# Patient Record
Sex: Male | Born: 2000 | Race: White | Hispanic: No | Marital: Single | Smoking: Current every day smoker
Health system: Southern US, Community
[De-identification: ages and names within clinical notes are randomized; demographics above are authoritative.]

## PROBLEM LIST (undated history)

## (undated) HISTORY — PX: TONSILLECTOMY: SUR1361

## (undated) HISTORY — PX: HAND SURGERY: SHX662

## (undated) HISTORY — PX: TESTICLE SURGERY: SHX794

---
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
## 2018-02-20 ENCOUNTER — Emergency Department
Admission: EM | Admit: 2018-02-20 | Discharge: 2018-02-20 | Disposition: A | Discharge status: Home / Self Care | Payer: No Typology Code available for payment source | Attending: Emergency Medicine | Admitting: Emergency Medicine

## 2018-02-20 ENCOUNTER — Encounter: Payer: Self-pay | Admitting: Emergency Medicine

## 2018-02-20 ENCOUNTER — Other Ambulatory Visit: Payer: Self-pay

## 2018-02-20 DIAGNOSIS — F17210 Nicotine dependence, cigarettes, uncomplicated: Secondary | ICD-10-CM | POA: Diagnosis not present

## 2018-02-20 DIAGNOSIS — Z202 Contact with and (suspected) exposure to infections with a predominantly sexual mode of transmission: Secondary | ICD-10-CM | POA: Insufficient documentation

## 2018-02-20 LAB — URINALYSIS, COMPLETE (UACMP) WITH MICROSCOPIC
BACTERIA UA: NONE SEEN
Bilirubin Urine: NEGATIVE
Glucose, UA: NEGATIVE mg/dL
Hgb urine dipstick: NEGATIVE
KETONES UR: NEGATIVE mg/dL
Nitrite: NEGATIVE
Protein, ur: NEGATIVE mg/dL
SQUAMOUS EPITHELIAL / LPF: NONE SEEN (ref 0–5)
Specific Gravity, Urine: 1.015 (ref 1.005–1.030)
pH: 8 (ref 5.0–8.0)

## 2018-02-20 LAB — CHLAMYDIA/NGC RT PCR (ARMC ONLY)
CHLAMYDIA TR: DETECTED — AB
N GONORRHOEAE: NOT DETECTED

## 2018-02-20 MED ORDER — METRONIDAZOLE 500 MG PO TABS
2000.0000 mg | ORAL_TABLET | Freq: Once | ORAL | Status: AC
  Administered 2018-02-20: 2000 mg via ORAL
  Filled 2018-02-20: qty 4

## 2018-02-20 MED ORDER — AZITHROMYCIN 500 MG PO TABS
1000.0000 mg | ORAL_TABLET | Freq: Once | ORAL | Status: AC
  Administered 2018-02-20: 1000 mg via ORAL
  Filled 2018-02-20: qty 2

## 2018-02-20 MED ORDER — CEFTRIAXONE SODIUM 250 MG IJ SOLR
250.0000 mg | Freq: Once | INTRAMUSCULAR | Status: AC
  Administered 2018-02-20: 250 mg via INTRAMUSCULAR
  Filled 2018-02-20: qty 250

## 2018-02-20 NOTE — ED Provider Notes (Signed)
[facility] Emergency Department Provider Note  ____________________________________________  Time seen: Approximately 8:42 PM  I have reviewed the triage vital signs and the nursing notes.   HISTORY  Chief Complaint Exposure to STD    HPI Frank Nunez is a 17 y.o. male presents to the emergency department after exposure to gonorrhea.  Patient has noticed penile itching but no discharge, dysuria or hematuria.  Patient denies low back pain.  Patient is accompanied by his infant daughter and mother.   History reviewed. No pertinent past medical history.  There are no active problems to display for this patient.   Past Surgical History:  Procedure Laterality Date  . HAND SURGERY    . TESTICLE SURGERY    . TONSILLECTOMY      Prior to Admission medications   Medication Sig Start Date End Date Taking? Authorizing Provider  cyclobenzaprine (FLEXERIL) 10 MG tablet Take 1 tablet (10 mg total) by mouth 3 (three) times daily as needed for muscle spasms. 12/07/17   Willy Eddy, MD  naproxen (NAPROSYN) 500 MG tablet Take 1 tablet (500 mg total) by mouth 2 (two) times daily with a meal. 12/07/17 12/07/18  Willy Eddy, MD    Allergies Morphine and related  No family history on file.  Social History Social History   Tobacco Use  . Smoking status: Current Every Day Smoker    Packs/day: 0.25    Types: Cigarettes  . Smokeless tobacco: Never Used  Substance Use Topics  . Alcohol use: No  . Drug use: Not on file     Review of Systems  Constitutional: No fever/chills Eyes: No visual changes. No discharge ENT: No upper respiratory complaints. Cardiovascular: no chest pain. Respiratory: no cough. No SOB. Gastrointestinal: No abdominal pain.  No nausea, no vomiting.  No diarrhea.  No constipation. Genitourinary: Negative for dysuria. No hematuria. Patient has penile itching.  Musculoskeletal: Negative for musculoskeletal pain. Skin:  Negative for rash, abrasions, lacerations, ecchymosis.  ____________________________________________   PHYSICAL EXAM:  VITAL SIGNS: ED Triage Vitals [02/20/18 1922]  Enc Vitals Group     BP 128/69     Pulse Rate 79     Resp 18     Temp 98.5 F (36.9 C)     Temp Source Oral     SpO2 99 %     Weight 160 lb (72.6 kg)     Height 5\' 10"  (1.778 m)     Head Circumference      Peak Flow      Pain Score 0     Pain Loc      Pain Edu?      Excl. in GC?      Constitutional: Alert and oriented. Well appearing and in no acute distress. Eyes: Conjunctivae are normal. PERRL. EOMI. Head: Atraumatic. Cardiovascular: Normal rate, regular rhythm. Normal S1 and S2.  Good peripheral circulation. Respiratory: Normal respiratory effort without tachypnea or retractions. Lungs CTAB. Good air entry to the bases with no decreased or absent breath sounds. Gastrointestinal: Bowel sounds 4 quadrants. Soft and nontender to palpation. No guarding or rigidity. No palpable masses. No distention. No CVA tenderness. Musculoskeletal: Full range of motion to all extremities. No gross deformities appreciated. Neurologic:  Normal speech and language. No gross focal neurologic deficits are appreciated.  Skin:  Skin is warm, dry and intact. No rash noted. Psychiatric: Mood and affect are normal. Speech and behavior are normal. Patient exhibits appropriate insight and judgement.   ____________________________________________   LABS (  all labs ordered are listed, but only abnormal results are displayed)  Labs Reviewed  URINALYSIS, COMPLETE (UACMP) WITH MICROSCOPIC - Abnormal; Notable for the following components:      Result Value   Color, Urine YELLOW (*)    APPearance CLEAR (*)    Leukocytes, UA TRACE (*)    All other components within normal limits  CHLAMYDIA/NGC RT PCR ([facility] ONLY)    ____________________________________________  EKG   ____________________________________________  RADIOLOGY   No results found.  ____________________________________________    PROCEDURES  Procedure(s) performed:    Procedures    Medications  cefTRIAXone (ROCEPHIN) injection 250 mg (250 mg Intramuscular Given 02/20/18 2006)  azithromycin (ZITHROMAX) tablet 1,000 mg (1,000 mg Oral Given 02/20/18 2006)  metroNIDAZOLE (FLAGYL) tablet 2,000 mg (2,000 mg Oral Given 02/20/18 2006)     ____________________________________________   INITIAL IMPRESSION / ASSESSMENT AND PLAN / ED COURSE  Pertinent labs & imaging results that were available during my care of the patient were reviewed by me and considered in my medical decision making (see chart for details).  Review of the [place] CSRS was performed in accordance of the [facility] prior to dispensing any controlled drugs.    Assessment and plan STD exposure Patient presents to the emergency department after exposure to gonorrhea.  Trace leukocytes were identified on urinalysis.  Patient was treated empirically for gonorrhea, chlamydia and trichomoniasis in the emergency department.  Patient was advised to follow-up with local health department for full STD panel. Patient education regarding safe sex practices were given.     ____________________________________________  FINAL CLINICAL IMPRESSION(S) / ED DIAGNOSES  Final diagnoses:  STD exposure      NEW MEDICATIONS STARTED DURING THIS VISIT:  ED Discharge Orders    None          This chart was dictated using voice recognition software/Dragon. Despite best efforts to proofread, errors can occur which can change the meaning. Any change was purely unintentional.    Orvil FeilWoods, Nikky Duba M, PA-C 02/20/18 2045    Sharyn CreamerQuale, Mark, MD 02/20/18 [phone]

## 2018-02-20 NOTE — ED Triage Notes (Signed)
Patient ambulatory to triage with steady gait, without difficulty or distress noted; st exposed to gonorrhoea; denies any symptoms

## 2018-02-21 ENCOUNTER — Telehealth: Payer: Self-pay | Admitting: Emergency Medicine
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
## 2018-06-29 ENCOUNTER — Emergency Department: Payer: No Typology Code available for payment source

## 2018-06-29 ENCOUNTER — Other Ambulatory Visit: Payer: Self-pay

## 2018-06-29 ENCOUNTER — Emergency Department
Admission: EM | Admit: 2018-06-29 | Discharge: 2018-06-29 | Disposition: A | Discharge status: Home / Self Care | Payer: Self-pay | Attending: Emergency Medicine | Admitting: Emergency Medicine

## 2018-06-29 DIAGNOSIS — M545 Low back pain: Secondary | ICD-10-CM | POA: Insufficient documentation

## 2018-06-29 DIAGNOSIS — R079 Chest pain, unspecified: Secondary | ICD-10-CM

## 2018-06-29 DIAGNOSIS — Z79899 Other long term (current) drug therapy: Secondary | ICD-10-CM | POA: Insufficient documentation

## 2018-06-29 DIAGNOSIS — B349 Viral infection, unspecified: Secondary | ICD-10-CM | POA: Insufficient documentation

## 2018-06-29 DIAGNOSIS — F17210 Nicotine dependence, cigarettes, uncomplicated: Secondary | ICD-10-CM | POA: Insufficient documentation

## 2018-06-29 LAB — COMPREHENSIVE METABOLIC PANEL
ALK PHOS: 53 U/L (ref 38–126)
ALT: 14 U/L (ref 0–44)
ANION GAP: 10 (ref 5–15)
AST: 19 U/L (ref 15–41)
Albumin: 4.5 g/dL (ref 3.5–5.0)
BILIRUBIN TOTAL: 0.5 mg/dL (ref 0.3–1.2)
BUN: 8 mg/dL (ref 6–20)
CALCIUM: 9.1 mg/dL (ref 8.9–10.3)
CO2: 26 mmol/L (ref 22–32)
Chloride: 102 mmol/L (ref 98–111)
Creatinine, Ser: 1.09 mg/dL (ref 0.61–1.24)
GFR calc Af Amer: >60 mL/min (ref >60)
GLUCOSE: 99 mg/dL (ref 70–99)
POTASSIUM: 3.7 mmol/L (ref 3.5–5.1)
Sodium: 138 mmol/L (ref 135–145)
TOTAL PROTEIN: 7.7 g/dL (ref 6.5–8.1)

## 2018-06-29 LAB — MONONUCLEOSIS SCREEN: Mono Screen: NEGATIVE

## 2018-06-29 LAB — INFLUENZA PANEL BY PCR (TYPE A & B)
INFLAPCR: NEGATIVE
Influenza B By PCR: NEGATIVE

## 2018-06-29 LAB — CBC WITH DIFFERENTIAL/PLATELET
Abs Immature Granulocytes: 0.01 10*3/uL (ref 0.00–0.07)
BASOS ABS: 0 10*3/uL (ref 0.0–0.1)
Basophils Relative: 0 %
EOS PCT: 0 %
Eosinophils Absolute: 0 10*3/uL (ref 0.0–0.5)
HEMATOCRIT: 49.1 % (ref 39.0–52.0)
Hemoglobin: 16.4 g/dL (ref 13.0–17.0)
IMMATURE GRANULOCYTES: 0 %
LYMPHS PCT: 22 %
Lymphs Abs: 1.3 10*3/uL (ref 0.7–4.0)
MCH: 30.8 pg (ref 26.0–34.0)
MCHC: 33.4 g/dL (ref 30.0–36.0)
MCV: 92.3 fL (ref 80.0–100.0)
MONOS PCT: 11 %
Monocytes Absolute: 0.7 10*3/uL (ref 0.1–1.0)
Neutro Abs: 4 10*3/uL (ref 1.7–7.7)
Neutrophils Relative %: 67 %
Platelets: 182 10*3/uL (ref 150–400)
RBC: 5.32 MIL/uL (ref 4.22–5.81)
RDW: 11.9 % (ref 11.5–15.5)
WBC: 6 10*3/uL (ref 4.0–10.5)
nRBC: 0 % (ref 0.0–0.2)

## 2018-06-29 MED ORDER — DOXYCYCLINE HYCLATE 100 MG PO TABS
100.0000 mg | ORAL_TABLET | Freq: Once | ORAL | Status: AC
  Administered 2018-06-29: 100 mg via ORAL
  Filled 2018-06-29: qty 1

## 2018-06-29 MED ORDER — DOXYCYCLINE HYCLATE 100 MG PO TABS: 100.0000 mg | ORAL_TABLET | Freq: Two times a day (BID) | ORAL | 0 refills | Status: AC

## 2018-06-29 MED ORDER — IBUPROFEN 600 MG PO TABS
600.0000 mg | ORAL_TABLET | Freq: Once | ORAL | Status: AC
  Administered 2018-06-29: 600 mg via ORAL
  Filled 2018-06-29: qty 1

## 2018-06-29 NOTE — ED Provider Notes (Signed)
[facility] Emergency Department Provider Note  ____________________________________________   First MD Initiated Contact with Patient 06/29/18 0149     (approximate)  I have reviewed the triage vital signs and the nursing notes.   HISTORY  Chief Complaint Back Pain and Chest Pain   HPI Frank Nunez is a 17 y.o. male presents to the emergency department with several weeks of low back pain and several days of upper chest "tightness".  He also reports subjective fever and shaking chills at home for the past day or so which prompted the visit today.  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  His girlfriend was recently diagnosed with "bronchitis" and he is concerned he might have the same.  He denies rash.  He has no past medical history takes no medications.  He denies ever using intravenous drugs.  His symptoms are now constant moderate severity and nothing seems to make them better or worse.    No past medical history on file.  There are no active problems to display for this patient.   Past Surgical History:  Procedure Laterality Date  . HAND SURGERY    . TESTICLE SURGERY    . TONSILLECTOMY      Prior to Admission medications   Medication Sig Start Date End Date Taking? Authorizing Provider  cyclobenzaprine (FLEXERIL) 10 MG tablet Take 1 tablet (10 mg total) by mouth 3 (three) times daily as needed for muscle spasms. 12/07/17   Willy Eddy, MD  doxycycline (VIBRA-TABS) 100 MG tablet Take 1 tablet (100 mg total) by mouth 2 (two) times daily. 06/29/18   Merrily Brittle, MD  naproxen (NAPROSYN) 500 MG tablet Take 1 tablet (500 mg total) by mouth 2 (two) times daily with a meal. 12/07/17 12/07/18  Willy Eddy, MD    Allergies Morphine and related  No family history on file.  Social History Social History   Tobacco Use  . Smoking status: Current Every Day Smoker   Packs/day: 0.25    Types: Cigarettes  . Smokeless tobacco: Never Used  Substance Use Topics  . Alcohol use: No  . Drug use: Not on file    Review of Systems Constitutional: Positive for fevers and chills Eyes: No visual changes. ENT: Positive for sore throat. Cardiovascular: Positive for chest pain. Respiratory: Denies shortness of breath. Gastrointestinal: No abdominal pain.  No nausea, no vomiting.  No diarrhea.  No constipation. Genitourinary: Negative for dysuria. Musculoskeletal: Positive for back pain. Skin: Negative for rash. Neurological: Positive for headache   ____________________________________________   PHYSICAL EXAM:  VITAL SIGNS: ED Triage Vitals  Enc Vitals Group     BP 06/29/18 0012 (!) 110/58     Pulse Rate 06/29/18 0012 86     Resp 06/29/18 0012 18     Temp 06/29/18 0012 98.4 F (36.9 C)     Temp Source 06/29/18 0012 Oral     SpO2 06/29/18 0012 98 %     Weight 06/29/18 0012 165 lb (74.8 kg)     Height 06/29/18 0012 5\' 10"  (1.778 m)     Head Circumference --      Peak Flow --      Pain Score 06/29/18 0011 10     Pain Loc --      Pain Edu? --      Excl. in GC? --     Constitutional: Alert and oriented x4 appears uncomfortable and  moderately diaphoretic Eyes: PERRL EOMI. Head: Atraumatic. Nose: No congestion/rhinnorhea. Mouth/Throat: No trismus uvula midline with no pharyngeal erythema or exudate Neck: No stridor.   Cardiovascular: Normal rate, regular rhythm.  2 out of 6 systolic murmur best heard at left sternal border Respiratory: Slightly increased respiratory effort.  No retractions. Lungs CTAB and moving good air Gastrointestinal: Soft nontender Musculoskeletal: No lower extremity edema   Neurologic:  Normal speech and language. No gross focal neurologic deficits are appreciated. Skin: Diaphoretic Psychiatric: Mood and affect are normal. Speech and behavior are normal.    ____________________________________________   DIFFERENTIAL  includes but not limited to  [facility] mountain spotted fever, ehrlichiosis, bacteremia, endocarditis, influenza, mononucleosis ____________________________________________   LABS (all labs ordered are listed, but only abnormal results are displayed)  Labs Reviewed  CULTURE, BLOOD (ROUTINE X 2)  CULTURE, BLOOD (ROUTINE X 2)  COMPREHENSIVE METABOLIC PANEL  CBC WITH DIFFERENTIAL/PLATELET  INFLUENZA PANEL BY PCR (TYPE A & B)  MONONUCLEOSIS SCREEN    Lab work reviewed by me with no acute disease __________________________________________  EKG  ED ECG REPORT I, Merrily Brittle, the attending physician, personally viewed and interpreted this ECG.  Date: 06/29/2018 EKG Time:  Rate: 63 Rhythm: normal sinus rhythm QRS Axis: normal Intervals: normal ST/T Wave abnormalities: normal Narrative Interpretation: no evidence of acute ischemia  ____________________________________________  RADIOLOGY  Chest x-ray reviewed by me with no acute disease ____________________________________________   PROCEDURES  Procedure(s) performed: no  Procedures  Critical Care performed: no  ____________________________________________   INITIAL IMPRESSION / ASSESSMENT AND PLAN / ED COURSE  Pertinent labs & imaging results that were available during my care of the patient were reviewed by me and considered in my medical decision making (see chart for details).   As part of my medical decision making, I reviewed the following data within the electronic MEDICAL RECORD NUMBER History obtained from family if available, nursing notes, old chart and ekg, as well as notes from prior ED visits.  Patient comes to the emergency department with low back pain, chest pain, headache, sore throat along with subjective fever and diaphoresis here.  Differential is quite broad and most concerning would be endocarditis versus discitis etc.  He adamantly denies ever using intravenous drugs.  He does work outside and is  exposed to ticks raising concern for ehrlichiosis versus [facility] spotted fever.  Influenza and mononucleosis testing is done and I will also draw blood cultures but I think is most reasonable to treat him with doxycycline for a week and refer him back to primary care.  Strict return precautions have been given and the patient verbalizes understanding and agreement with the plan.      ____________________________________________   FINAL CLINICAL IMPRESSION(S) / ED DIAGNOSES  Final diagnoses:  Chest pain, unspecified type  Viral syndrome      NEW MEDICATIONS STARTED DURING THIS VISIT:  Discharge Medication List as of 06/29/2018  3:30 AM    START taking these medications   Details  doxycycline (VIBRA-TABS) 100 MG tablet Take 1 tablet (100 mg total) by mouth 2 (two) times daily., Starting Sun 06/29/2018, Print         Note:  This document was prepared using Dragon voice recognition software and may include unintentional dictation errors.     Merrily Brittle, MD 06/29/18 [phone]

## 2018-06-29 NOTE — Discharge Instructions (Signed)
Please take all of your antibiotics as prescribed and make an appointment to follow-up with your primary care physician tomorrow for a recheck.

## 2018-06-29 NOTE — ED Triage Notes (Signed)
Reports lower back pain "for weeks" tonight with some chest tightness.

## 2018-07-04 LAB — CULTURE, BLOOD (ROUTINE X 2): CULTURE: NO GROWTH

## 2018-11-12 ENCOUNTER — Emergency Department
Admission: EM | Admit: 2018-11-12 | Discharge: 2018-11-12 | Disposition: A | Discharge status: Home / Self Care | Payer: Self-pay | Attending: Emergency Medicine | Admitting: Emergency Medicine

## 2018-11-12 ENCOUNTER — Emergency Department: Payer: Self-pay

## 2018-11-12 ENCOUNTER — Other Ambulatory Visit: Payer: Self-pay

## 2018-11-12 ENCOUNTER — Encounter: Payer: Self-pay | Admitting: Emergency Medicine

## 2018-11-12 DIAGNOSIS — F17210 Nicotine dependence, cigarettes, uncomplicated: Secondary | ICD-10-CM | POA: Insufficient documentation

## 2018-11-12 DIAGNOSIS — M7542 Impingement syndrome of left shoulder: Secondary | ICD-10-CM | POA: Insufficient documentation

## 2018-11-12 DIAGNOSIS — Z79899 Other long term (current) drug therapy: Secondary | ICD-10-CM | POA: Insufficient documentation

## 2018-11-12 MED ORDER — MELOXICAM 15 MG PO TABS: 15.0000 mg | ORAL_TABLET | Freq: Every day | ORAL | 0 refills | Status: AC

## 2018-11-12 MED ORDER — MELOXICAM 7.5 MG PO TABS: 15.0000 mg | ORAL_TABLET | Freq: Once | ORAL | Status: DC

## 2018-11-12 MED ORDER — TRAMADOL HCL 50 MG PO TABS
50.0000 mg | ORAL_TABLET | Freq: Once | ORAL | Status: AC
  Administered 2018-11-12: 50 mg via ORAL
  Filled 2018-11-12: qty 1

## 2018-11-12 MED ORDER — TRAMADOL HCL 50 MG PO TABS: 50.0000 mg | ORAL_TABLET | Freq: Four times a day (QID) | ORAL | 0 refills | Status: AC | PRN

## 2018-11-12 MED ORDER — IBUPROFEN 800 MG PO TABS
800.0000 mg | ORAL_TABLET | Freq: Once | ORAL | Status: AC
  Administered 2018-11-12: 800 mg via ORAL
  Filled 2018-11-12: qty 1

## 2018-11-12 NOTE — ED Provider Notes (Signed)
[facility] Emergency Department Provider Note  ____________________________________________  Time seen: Approximately 11:00 PM  I have reviewed the triage vital signs and the nursing notes.   HISTORY  Chief Complaint Clavicle Injury    HPI Frank Nunez is a 18 y.o. male who presents the emergency department complaining of left shoulder/clavicular pain.  Patient denies any trauma to the shoulder but he states that he has had increasing pain over the past several days.  Patient reports that in the remote past he had a history of clavicle fracture that felt similarly.  Patient denies any neck pain, radicular symptoms in the left upper extremity.  Patient has not tried any medications at home for his complaint.  He denies any other complaints of headache, neck pain, chest pain, shortness of breath abdominal pain, nausea vomiting.    History reviewed. No pertinent past medical history.  There are no active problems to display for this patient.   Past Surgical History:  Procedure Laterality Date  . HAND SURGERY    . TESTICLE SURGERY    . TONSILLECTOMY      Prior to Admission medications   Medication Sig Start Date End Date Taking? Authorizing Provider  cyclobenzaprine (FLEXERIL) 10 MG tablet Take 1 tablet (10 mg total) by mouth 3 (three) times daily as needed for muscle spasms. 12/07/17   Willy Eddy, MD  doxycycline (VIBRA-TABS) 100 MG tablet Take 1 tablet (100 mg total) by mouth 2 (two) times daily. 06/29/18   Merrily Brittle, MD  meloxicam (MOBIC) 15 MG tablet Take 1 tablet (15 mg total) by mouth daily. 11/12/18   Quinnlyn Hearns, Delorise Royals, PA-C  naproxen (NAPROSYN) 500 MG tablet Take 1 tablet (500 mg total) by mouth 2 (two) times daily with a meal. 12/07/17 12/07/18  Willy Eddy, MD  traMADol (ULTRAM) 50 MG tablet Take 1 tablet (50 mg total) by mouth every 6 (six) hours as needed. 11/12/18   Silvia Markuson, Delorise Royals, PA-C    Allergies Morphine and  related  No family history on file.  Social History Social History   Tobacco Use  . Smoking status: Current Every Day Smoker    Packs/day: 0.25    Types: Cigarettes  . Smokeless tobacco: Never Used  Substance Use Topics  . Alcohol use: No  . Drug use: Not on file     Review of Systems  Constitutional: No fever/chills Eyes: No visual changes.  Cardiovascular: no chest pain. Respiratory: no cough. No SOB. Gastrointestinal: No abdominal pain.  No nausea, no vomiting.  Musculoskeletal: Positive for L shoulder/clavicle pain Skin: Negative for rash, abrasions, lacerations, ecchymosis. Neurological: Negative for headaches, focal weakness or numbness. 10-point ROS otherwise negative.  ____________________________________________   PHYSICAL EXAM:  VITAL SIGNS: ED Triage Vitals  Enc Vitals Group     BP 11/12/18 2159 133/65     Pulse Rate 11/12/18 2159 80     Resp 11/12/18 2159 18     Temp 11/12/18 2159 98 F (36.7 C)     Temp Source 11/12/18 2159 Oral     SpO2 11/12/18 2159 98 %     Weight 11/12/18 2158 160 lb (72.6 kg)     Height 11/12/18 2158  (1.778 m)     Head Circumference --      Peak Flow --      Pain Score 11/12/18 2158 10     Pain Loc --      Pain Edu? --      Excl. in GC? --  Constitutional: Alert and oriented. Well appearing and in no acute distress. Eyes: Conjunctivae are normal. PERRL. EOMI. Head: Atraumatic. Neck: No stridor.  No cervical spine tenderness to palpation.  Cardiovascular: Normal rate, regular rhythm. Normal S1 and S2.  Good peripheral circulation. Respiratory: Normal respiratory effort without tachypnea or retractions. Lungs CTAB. Good air entry to the bases with no decreased or absent breath sounds. Musculoskeletal: Full range of motion to all extremities. No gross deformities appreciated.  Visualization of the left shoulder reveals no visible signs of trauma.  Patient has full range of motion to the left shoulder.  Patient is  tender to palpation over the distal one third of the clavicle as well as over the acromioclavicular joint space.  No other tenderness to palpation.  Special tests are negative.  Radial pulse intact distally.  Sensation intact distally. Neurologic:  Normal speech and language. No gross focal neurologic deficits are appreciated.  Skin:  Skin is warm, dry and intact. No rash noted. Psychiatric: Mood and affect are normal. Speech and behavior are normal. Patient exhibits appropriate insight and judgement.   ____________________________________________   LABS (all labs ordered are listed, but only abnormal results are displayed)  Labs Reviewed - No data to display ____________________________________________  EKG   ____________________________________________  RADIOLOGY I personally viewed and evaluated these images as part of my medical decision making, as well as reviewing the written report by the radiologist.  Dg Clavicle Left  Result Date: 11/12/2018 CLINICAL DATA:  Left clavicular pain. Previous fracture treated with anti-inflammatory agents. EXAM: LEFT CLAVICLE - 2+ VIEWS COMPARISON:  None. FINDINGS: Left clavicle appears intact. No acute fracture or dislocation. Coracoclavicular and acromioclavicular spaces are maintained. No focal bone lesion or bone destruction. Soft tissues are unremarkable. IMPRESSION: No acute bony abnormalities. Electronically Signed   By: Burman Nieves M.D.   On: 11/12/2018 22:45    ____________________________________________    PROCEDURES  Procedure(s) performed:    Procedures    Medications  meloxicam (MOBIC) tablet 15 mg (has no administration in time range)  traMADol (ULTRAM) tablet 50 mg (has no administration in time range)     ____________________________________________   INITIAL IMPRESSION / ASSESSMENT AND PLAN / ED COURSE  Pertinent labs & imaging results that were available during my care of the patient were reviewed by me  and considered in my medical decision making (see chart for details).  Review of the [place] CSRS was performed in accordance of the [facility] prior to dispensing any controlled drugs.      Patient's diagnosis is consistent with impingement syndrome of the left shoulder.  Patient presents emergency department complaining of left shoulder pain.  Patient is tender to palpation of the acromioclavicular joint space.  No recent trauma to the area.  No other concerning findings or symptoms.  X-ray reveals no acute osseous abnormality.  No acromioclavicular joint space widening.  Patient will be placed on meloxicam and very limited prescription for Motrin..  Follow-up with primary care or orthopedics as needed.  Patient is given ED precautions to return to the ED for any worsening or new symptoms.     ____________________________________________  FINAL CLINICAL IMPRESSION(S) / ED DIAGNOSES  Final diagnoses:  Impingement syndrome of left shoulder      NEW MEDICATIONS STARTED DURING THIS VISIT:  ED Discharge Orders         Ordered    meloxicam (MOBIC) 15 MG tablet  Daily     11/12/18 2309    traMADol (ULTRAM) 50 MG tablet  Every 6 hours PRN     11/12/18 2309              This chart was dictated using voice recognition software/Dragon. Despite best efforts to proofread, errors can occur which can change the meaning. Any change was purely unintentional.    Racheal Patches, PA-C 11/12/18 2309    Phineas Semen, MD 11/12/18 [phone]

## 2018-11-12 NOTE — ED Triage Notes (Signed)
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

## 2019-06-09 IMAGING — CR DG CHEST 2V
2 series · 2 of 2 positions shown · non-contrast
Comparison: 04/01/2015.

CLINICAL DATA: Chest pain for 1 week.  Smoker.

EXAM:
CHEST - 2 VIEW

[chest pa]
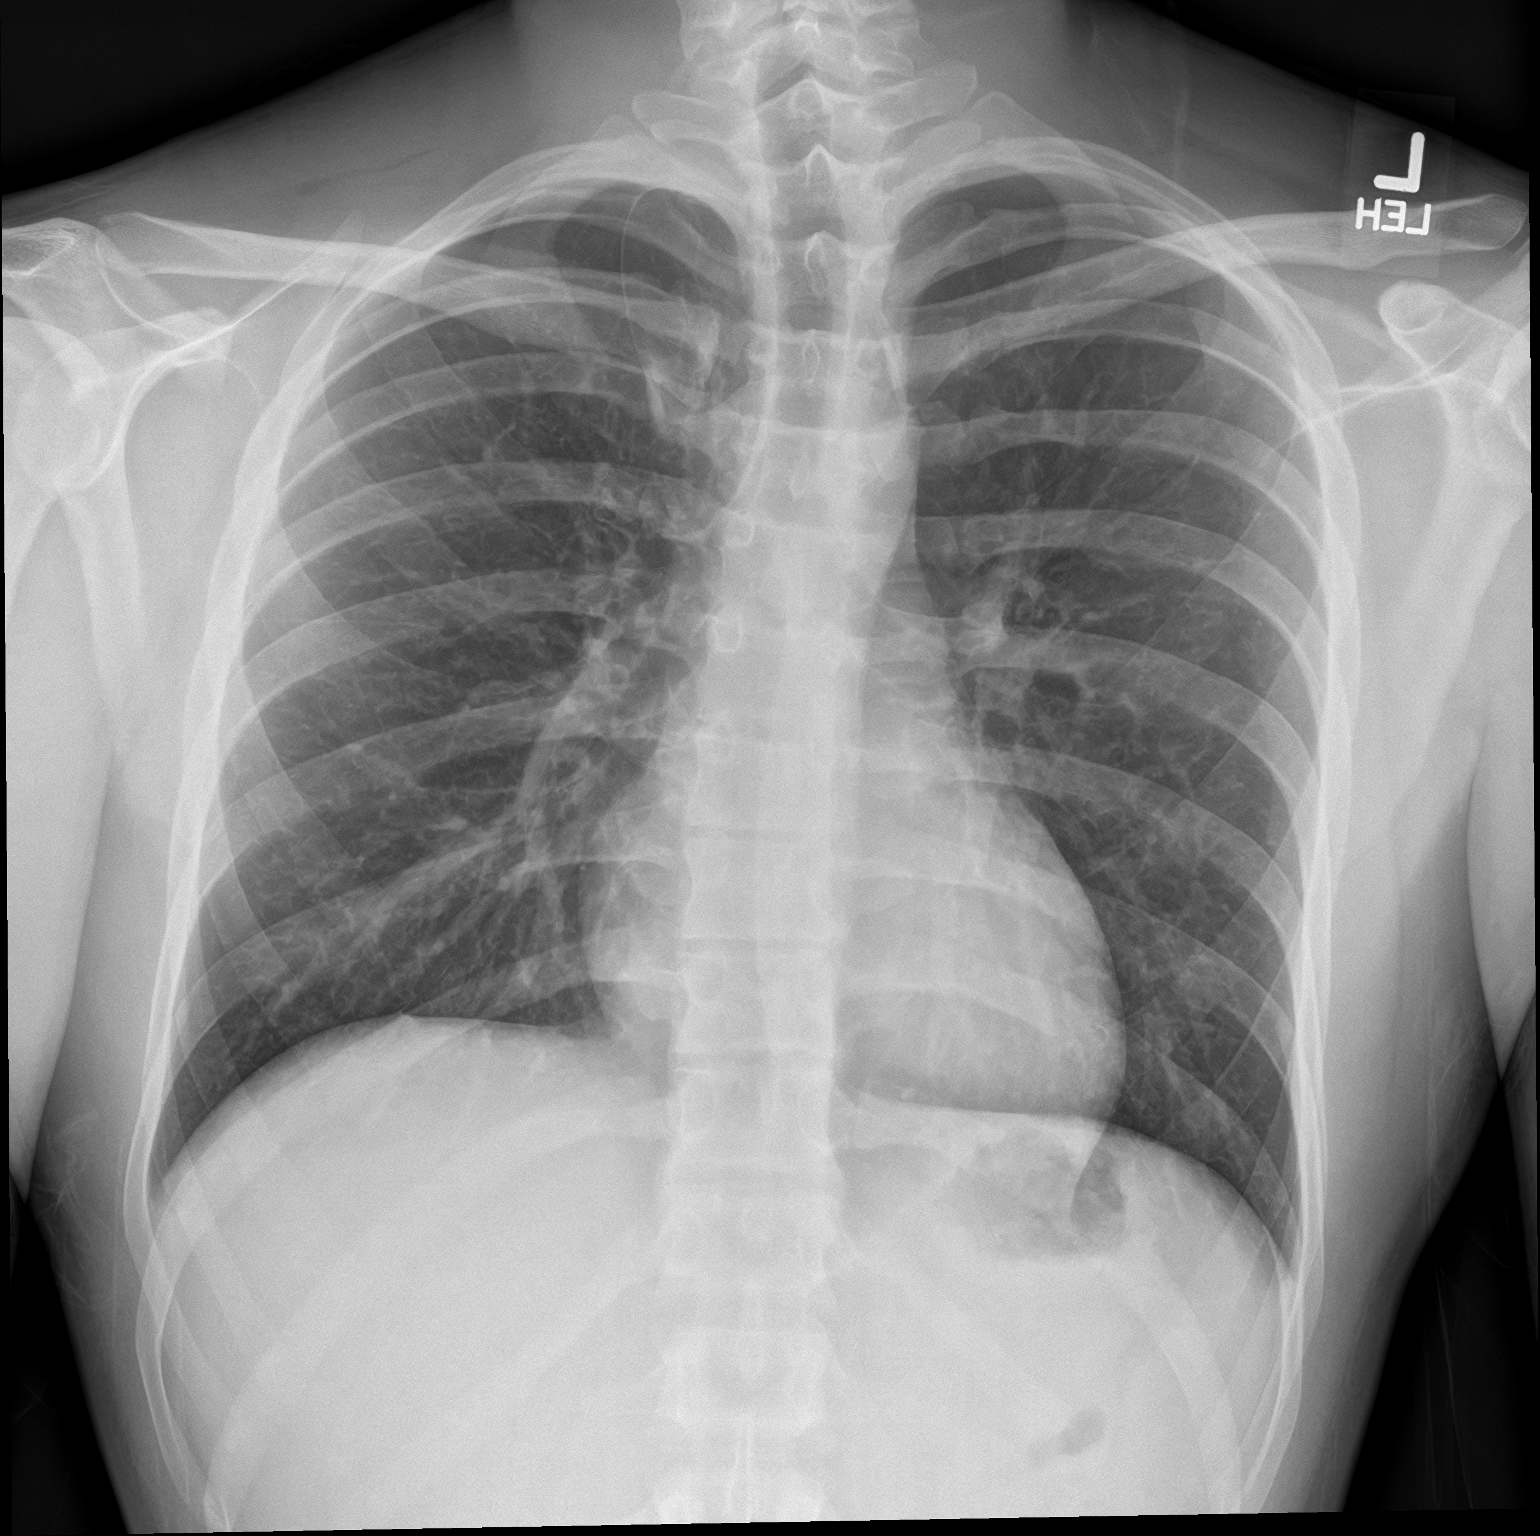

[chest lat]
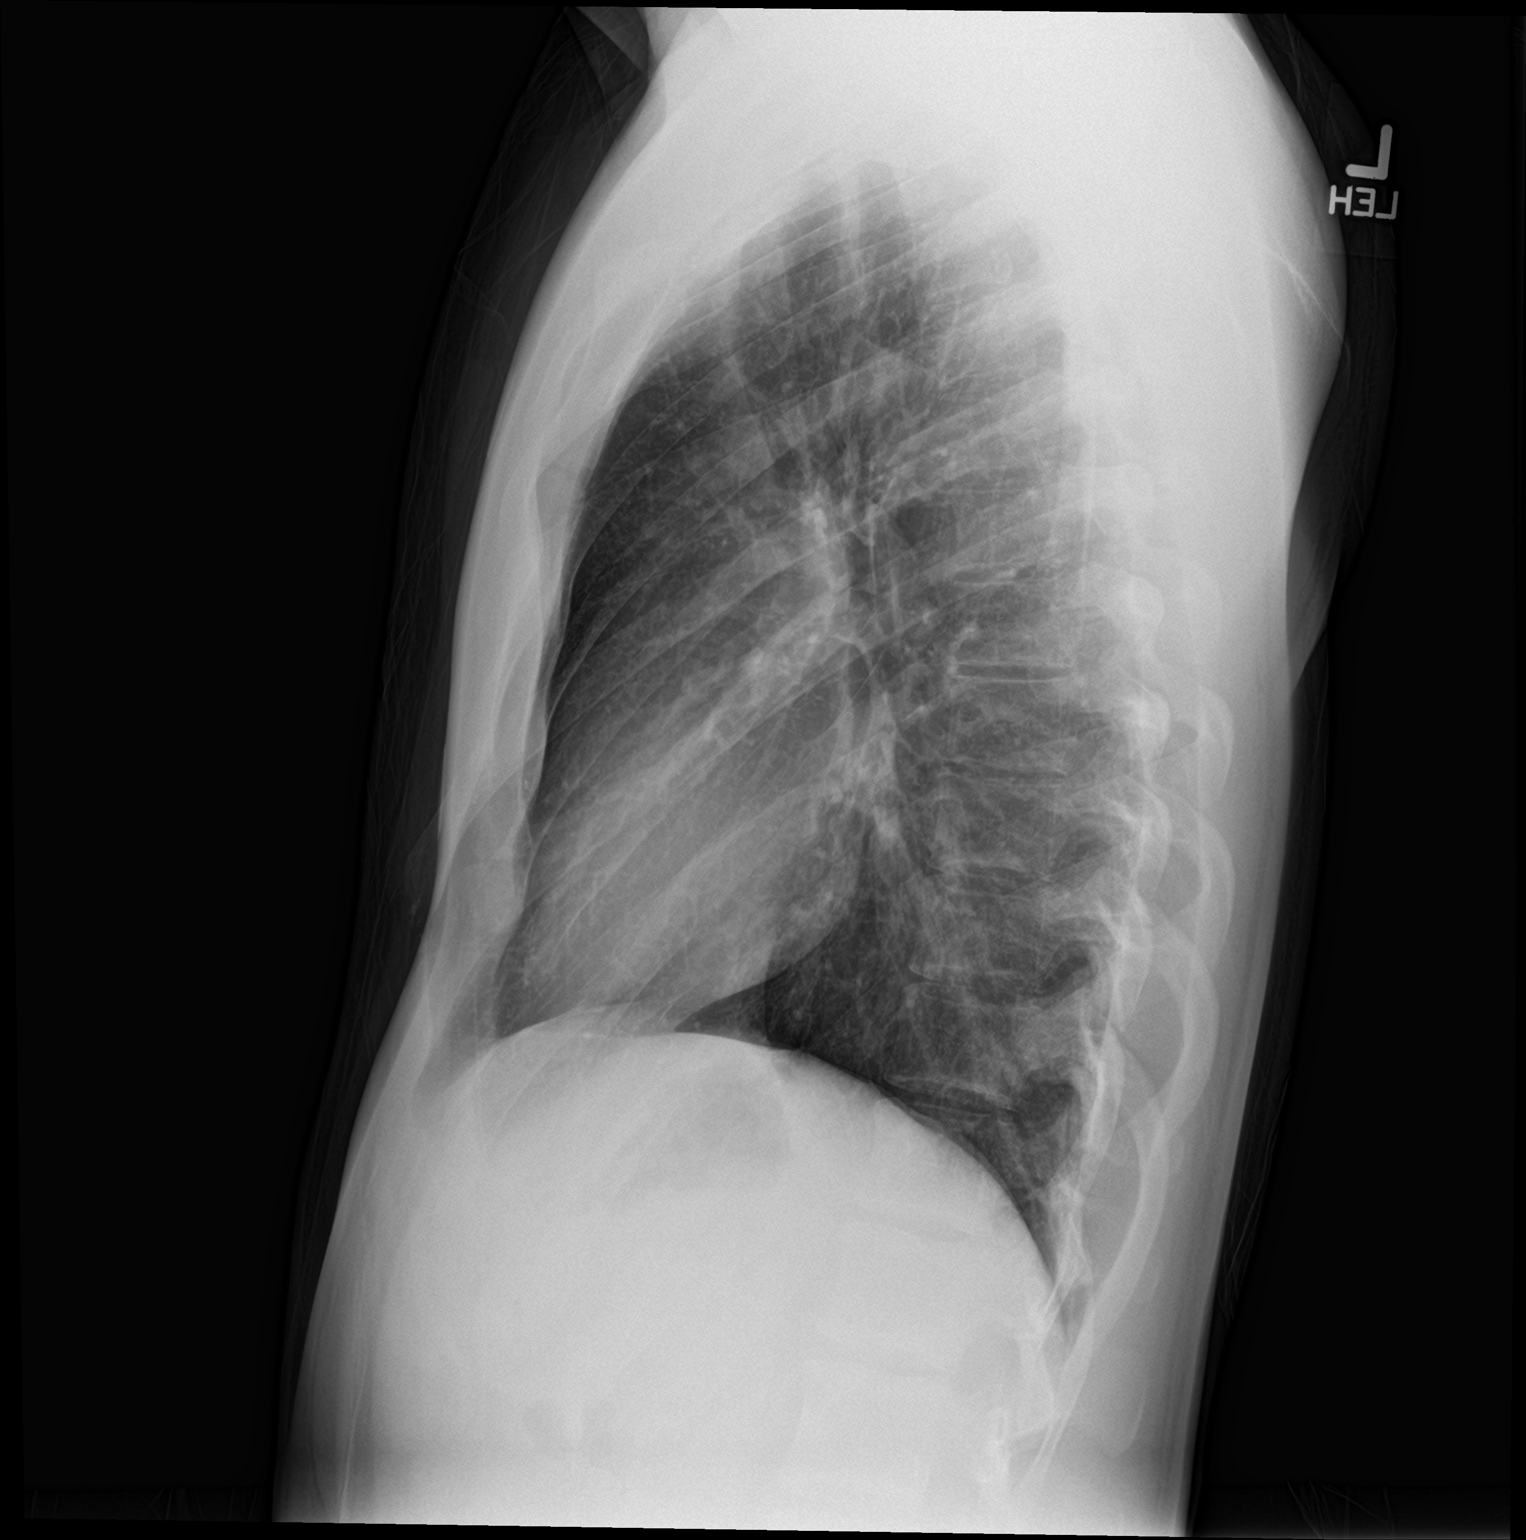

[2 of 2 positions shown; findings below may reference images not displayed]

FINDINGS: Normal sized heart.  Clear lungs.  Mild scoliosis.
IMPRESSION: No acute abnormality.

## 2019-12-01 ENCOUNTER — Emergency Department
Admission: EM | Admit: 2019-12-01 | Discharge: 2019-12-01 | Disposition: A | Discharge status: Home / Self Care | Payer: Medicaid Other | Attending: Emergency Medicine | Admitting: Emergency Medicine

## 2019-12-01 ENCOUNTER — Encounter: Payer: Self-pay | Admitting: Emergency Medicine

## 2019-12-01 ENCOUNTER — Other Ambulatory Visit: Payer: Self-pay

## 2019-12-01 ENCOUNTER — Emergency Department: Payer: Medicaid Other

## 2019-12-01 DIAGNOSIS — F17210 Nicotine dependence, cigarettes, uncomplicated: Secondary | ICD-10-CM | POA: Insufficient documentation

## 2019-12-01 DIAGNOSIS — M545 Low back pain, unspecified: Secondary | ICD-10-CM

## 2019-12-01 DIAGNOSIS — Z79899 Other long term (current) drug therapy: Secondary | ICD-10-CM | POA: Insufficient documentation

## 2019-12-01 MED ORDER — BACLOFEN 5 MG PO TABS: 5.0000 mg | ORAL_TABLET | Freq: Three times a day (TID) | ORAL | 0 refills | Status: AC | PRN

## 2019-12-01 MED ORDER — IBUPROFEN 600 MG PO TABS: 600.0000 mg | ORAL_TABLET | Freq: Four times a day (QID) | ORAL | 0 refills | Status: AC | PRN

## 2019-12-01 MED ORDER — KETOROLAC TROMETHAMINE 30 MG/ML IJ SOLN
30.0000 mg | Freq: Once | INTRAMUSCULAR | Status: AC
  Administered 2019-12-01: 30 mg via INTRAMUSCULAR
  Filled 2019-12-01: qty 1

## 2019-12-01 NOTE — ED Provider Notes (Signed)
[facility] Emergency Department Provider Note  ____________________________________________  Time seen: Approximately 5:45 PM  I have reviewed the triage vital signs and the nursing notes.   HISTORY  Chief Complaint Back Pain    HPI Frank Nunez is a 19 y.o. male that presents to the emergency department for evaluation of acute on chronic low back pain.  Patient states that he has had back pain for at least 1 year.  He had a trampoline injury about 1 year ago when symptoms started.  Pain has been worse the last 2 to 3 days.  Patient does a lot of twisting at work, which he feels has aggravated it.  He has taken Tylenol for pain. He is walking.  No bowel or bladder dysfunction or saddle anesthesias.  No numbness, tingling.  History reviewed. No pertinent past medical history.  There are no problems to display for this patient.   Past Surgical History:  Procedure Laterality Date  . HAND SURGERY    . TESTICLE SURGERY    . TONSILLECTOMY      Prior to Admission medications   Medication Sig Start Date End Date Taking? Authorizing Provider  Baclofen 5 MG TABS Take 5 mg by mouth 3 (three) times daily as needed. 12/01/19   Enid Derry, PA-C  cyclobenzaprine (FLEXERIL) 10 MG tablet Take 1 tablet (10 mg total) by mouth 3 (three) times daily as needed for muscle spasms. 12/07/17   Willy Eddy, MD  doxycycline (VIBRA-TABS) 100 MG tablet Take 1 tablet (100 mg total) by mouth 2 (two) times daily. 06/29/18   Merrily Brittle, MD  ibuprofen (ADVIL) 600 MG tablet Take 1 tablet (600 mg total) by mouth every 6 (six) hours as needed. 12/01/19   Enid Derry, PA-C  meloxicam (MOBIC) 15 MG tablet Take 1 tablet (15 mg total) by mouth daily. 11/12/18   Cuthriell, Delorise Royals, PA-C  traMADol (ULTRAM) 50 MG tablet Take 1 tablet (50 mg total) by mouth every 6 (six) hours as needed. 11/12/18   Cuthriell, Delorise Royals, PA-C    Allergies Morphine and related  No family  history on file.  Social History Social History   Tobacco Use  . Smoking status: Current Every Day Smoker    Packs/day: 0.25    Types: Cigarettes  . Smokeless tobacco: Never Used  Substance Use Topics  . Alcohol use: No  . Drug use: Never     Review of Systems  Constitutional: No fever/chills Respiratory: No SOB. Gastrointestinal: No abdominal pain.  No nausea, no vomiting.  Musculoskeletal: Positive for low back pain. Skin: Negative for rash, abrasions, lacerations, ecchymosis. Neurological: Negative for headaches, numbness or tingling   ____________________________________________   PHYSICAL EXAM:  VITAL SIGNS: ED Triage Vitals  Enc Vitals Group     BP 12/01/19 1616 128/74     Pulse Rate 12/01/19 1616 98     Resp 12/01/19 1616 16     Temp 12/01/19 1616 98.3 F (36.8 C)     Temp Source 12/01/19 1616 Oral     SpO2 12/01/19 1616 100 %     Weight 12/01/19 1613 170 lb (77.1 kg)     Height 12/01/19 1613 5\' 10"  (1.778 m)     Head Circumference --      Peak Flow --      Pain Score 12/01/19 1613 7     Pain Loc --      Pain Edu? --      Excl. in GC? --  Constitutional: Alert and oriented. Well appearing and in no acute distress. Eyes: Conjunctivae are normal. PERRL. EOMI. Head: Atraumatic. ENT:      Ears:      Nose: No congestion/rhinnorhea.      Mouth/Throat: Mucous membranes are moist.  Neck: No stridor. Cardiovascular: Normal rate, regular rhythm.  Good peripheral circulation. Respiratory: Normal respiratory effort without tachypnea or retractions. Lungs CTAB. Good air entry to the bases with no decreased or absent breath sounds. Musculoskeletal: Full range of motion to all extremities. No gross deformities appreciated.  No pinpoint tenderness to lumbar spine or lumbar paraspinal muscles.  Strength equal in lower extremities bilaterally. Normal gait. Neurologic:  Normal speech and language. No gross focal neurologic deficits are appreciated.  Skin:  Skin  is warm, dry and intact. No rash noted. Psychiatric: Mood and affect are normal. Speech and behavior are normal. Patient exhibits appropriate insight and judgement.   ____________________________________________   LABS (all labs ordered are listed, but only abnormal results are displayed)  Labs Reviewed - No data to display ____________________________________________  EKG   ____________________________________________  RADIOLOGY Lexine Baton, personally viewed and evaluated these images (plain radiographs) as part of my medical decision making, as well as reviewing the written report by the radiologist.  DG Lumbar Spine 2-3 Views  Result Date: 12/01/2019 CLINICAL DATA:  Back pain. EXAM: LUMBAR SPINE - 2-3 VIEW COMPARISON:  None. FINDINGS: There is no evidence of lumbar spine fracture. Alignment is normal. Intervertebral disc spaces are maintained. IMPRESSION: Negative. Electronically Signed   By: Katherine Mantle M.D.   On: 12/01/2019 18:36    ____________________________________________    PROCEDURES  Procedure(s) performed:    Procedures    Medications  ketorolac (TORADOL) 30 MG/ML injection 30 mg (30 mg Intramuscular Given 12/01/19 1902)     ____________________________________________   INITIAL IMPRESSION / ASSESSMENT AND PLAN / ED COURSE  Pertinent labs & imaging results that were available during my care of the patient were reviewed by me and considered in my medical decision making (see chart for details).  Review of the [place] CSRS was performed in accordance of the [facility] prior to dispensing any controlled drugs.   Patient presented to the emergency department for evaluation of low back pain.  Vital signs and exam are reassuring.  X-ray negative for acute bony abnormalities. Exam is unremarkable. Patient will be discharged home with prescriptions for baclofen and Motrin. Patient is to follow up with primary care as directed. Patient is given ED  precautions to return to the ED for any worsening or new symptoms.   Kadden E Grill was evaluated in Emergency Department on 12/01/2019 for the symptoms described in the history of present illness. He was evaluated in the context of the global COVID-19 pandemic, which necessitated consideration that the patient might be at risk for infection with the SARS-CoV-2 virus that causes COVID-19. Institutional protocols and algorithms that pertain to the evaluation of patients at risk for COVID-19 are in a state of rapid change based on information released by regulatory bodies including the CDC and federal and state organizations. These policies and algorithms were followed during the patient's care in the ED.  ____________________________________________  FINAL CLINICAL IMPRESSION(S) / ED DIAGNOSES  Final diagnoses:  Acute bilateral low back pain without sciatica      NEW MEDICATIONS STARTED DURING THIS VISIT:  ED Discharge Orders         Ordered    Baclofen 5 MG TABS  3 times daily PRN  12/01/19 1913    ibuprofen (ADVIL) 600 MG tablet  Every 6 hours PRN     12/01/19 1913              This chart was dictated using voice recognition software/Dragon. Despite best efforts to proofread, errors can occur which can change the meaning. Any change was purely unintentional.    Laban Emperor, PA-C 12/01/19 2208    Nance Pear, MD 12/01/19 2214

## 2019-12-01 NOTE — ED Triage Notes (Signed)
Pt in via POV, reports increasing back pain to mid back x 2-3 days.  Denies recent injury.  States, "I think I did something to it like a year ago."  Ambulatory to triage; NAD noted at this time.

## 2019-12-14 ENCOUNTER — Emergency Department
Admission: EM | Admit: 2019-12-14 | Discharge: 2019-12-14 | Disposition: A | Discharge status: Home / Self Care | Payer: Medicaid Other | Attending: Emergency Medicine | Admitting: Emergency Medicine

## 2019-12-14 ENCOUNTER — Encounter: Payer: Self-pay | Admitting: Emergency Medicine

## 2019-12-14 ENCOUNTER — Other Ambulatory Visit: Payer: Self-pay

## 2019-12-14 DIAGNOSIS — A084 Viral intestinal infection, unspecified: Secondary | ICD-10-CM | POA: Insufficient documentation

## 2019-12-14 DIAGNOSIS — Z79899 Other long term (current) drug therapy: Secondary | ICD-10-CM | POA: Insufficient documentation

## 2019-12-14 DIAGNOSIS — Z20822 Contact with and (suspected) exposure to covid-19: Secondary | ICD-10-CM | POA: Insufficient documentation

## 2019-12-14 DIAGNOSIS — F17210 Nicotine dependence, cigarettes, uncomplicated: Secondary | ICD-10-CM | POA: Insufficient documentation

## 2019-12-14 MED ORDER — PROMETHAZINE HCL 25 MG PO TABS: 25.0000 mg | ORAL_TABLET | Freq: Four times a day (QID) | ORAL | 0 refills | Status: AC | PRN

## 2019-12-14 NOTE — ED Triage Notes (Signed)
Patient ambulatory to triage with steady gait, without difficulty or distress noted, pt reports V x 2 today and was told by his boss that he needed to be seen; pt denies pain or any accomp symptoms

## 2019-12-14 NOTE — ED Provider Notes (Signed)
[facility] Emergency Department Provider Note  ____________________________________________  Time seen: Approximately 9:29 PM  I have reviewed the triage vital signs and the nursing notes.   HISTORY  Chief Complaint Nausea    HPI Frank Nunez is a 19 y.o. male who presents the emergency department complaining of nausea and 3 episodes of emesis today.  Patient states that he woke up with a turning stomach, had to run to the restroom and had an episode of emesis.  Patient has had 3 total episodes of emesis, the last one occurring roughly 10 hours ago.  Patient states that he had to call out of work due to his symptoms.  His boss require that he be evaluated prior to returning to work.  Patient states that his daughter is also experiencing similar symptoms.  He denies any fevers or chills, nasal congestion, sore throat, cough.  No abdominal pain.  Patient has had no diarrhea.  No medications prior to arrival.  Patient is eating and drinking appropriately this evening.  Patient is currently drinking Sprite and eating chips in the emergency department.         History reviewed. No pertinent past medical history.  There are no problems to display for this patient.   Past Surgical History:  Procedure Laterality Date  . HAND SURGERY    . TESTICLE SURGERY    . TONSILLECTOMY      Prior to Admission medications   Medication Sig Start Date End Date Taking? Authorizing Provider  Baclofen 5 MG TABS Take 5 mg by mouth 3 (three) times daily as needed. 12/01/19   Laban Emperor, PA-C  cyclobenzaprine (FLEXERIL) 10 MG tablet Take 1 tablet (10 mg total) by mouth 3 (three) times daily as needed for muscle spasms. 12/07/17   Merlyn Lot, MD  doxycycline (VIBRA-TABS) 100 MG tablet Take 1 tablet (100 mg total) by mouth 2 (two) times daily. 06/29/18   Darel Hong, MD  ibuprofen (ADVIL) 600 MG tablet Take 1 tablet (600 mg total) by mouth every 6 (six) hours as  needed. 12/01/19   Laban Emperor, PA-C  meloxicam (MOBIC) 15 MG tablet Take 1 tablet (15 mg total) by mouth daily. 11/12/18   Hugh Garrow, Charline Bills, PA-C  promethazine (PHENERGAN) 25 MG tablet Take 1 tablet (25 mg total) by mouth every 6 (six) hours as needed for nausea or vomiting. 12/14/19   Reg Bircher, Charline Bills, PA-C  traMADol (ULTRAM) 50 MG tablet Take 1 tablet (50 mg total) by mouth every 6 (six) hours as needed. 11/12/18   Fabyan Loughmiller, Charline Bills, PA-C    Allergies Morphine and related  No family history on file.  Social History Social History   Tobacco Use  . Smoking status: Current Every Day Smoker    Packs/day: 0.25    Types: Cigarettes  . Smokeless tobacco: Never Used  Substance Use Topics  . Alcohol use: No  . Drug use: Never     Review of Systems  Constitutional: No fever/chills Eyes: No visual changes. No discharge ENT: No upper respiratory complaints. Cardiovascular: no chest pain. Respiratory: no cough. No SOB. Gastrointestinal: No abdominal pain.  Positive for nausea with 3 episodes of emesis today no diarrhea.  No constipation. Musculoskeletal: Negative for musculoskeletal pain. Skin: Negative for rash, abrasions, lacerations, ecchymosis. Neurological: Negative for headaches, focal weakness or numbness. 10-point ROS otherwise negative.  ____________________________________________   PHYSICAL EXAM:  VITAL SIGNS: ED Triage Vitals  Enc Vitals Group     BP 12/14/19 2037 (!) 137/59  Pulse Rate 12/14/19 2037 87     Resp 12/14/19 2037 16     Temp 12/14/19 2037 98.7 F (37.1 C)     Temp Source 12/14/19 2037 Oral     SpO2 12/14/19 2037 99 %     Weight 12/14/19 2020 169 lb 15.6 oz (77.1 kg)     Height 12/14/19 2020 5\' 10"  (1.778 m)     Head Circumference --      Peak Flow --      Pain Score 12/14/19 2020 0     Pain Loc --      Pain Edu? --      Excl. in GC? --      Constitutional: Alert and oriented. Well appearing and in no acute distress. Eyes:  Conjunctivae are normal. PERRL. EOMI. Head: Atraumatic. ENT:      Ears:       Nose: No congestion/rhinnorhea.      Mouth/Throat: Mucous membranes are moist.  Neck: No stridor.    Cardiovascular: Normal rate, regular rhythm. Normal S1 and S2.  Good peripheral circulation. Respiratory: Normal respiratory effort without tachypnea or retractions. Lungs CTAB. Good air entry to the bases with no decreased or absent breath sounds. Gastrointestinal: Bowel sounds 4 quadrants. Soft and nontender to palpation. No guarding or rigidity. No palpable masses. No distention. No CVA tenderness. Musculoskeletal: Full range of motion to all extremities. No gross deformities appreciated. Neurologic:  Normal speech and language. No gross focal neurologic deficits are appreciated.  Skin:  Skin is warm, dry and intact. No rash noted. Psychiatric: Mood and affect are normal. Speech and behavior are normal. Patient exhibits appropriate insight and judgement.   ____________________________________________   LABS (all labs ordered are listed, but only abnormal results are displayed)  Labs Reviewed  SARS CORONAVIRUS 2 (TAT 6-24 HRS)   ____________________________________________  EKG   ____________________________________________  RADIOLOGY   No results found.  ____________________________________________    PROCEDURES  Procedure(s) performed:    Procedures    Medications - No data to display   ____________________________________________   INITIAL IMPRESSION / ASSESSMENT AND PLAN / ED COURSE  Pertinent labs & imaging results that were available during my care of the patient were reviewed by me and considered in my medical decision making (see chart for details).  Review of the  [place] was performed in accordance of the [facility] prior to dispensing any controlled drugs.           Patient's diagnosis is consistent with gastroenteritis.  Patient presented to emergency department with  1 day history of nausea vomiting.  Patient had 3 episodes of emesis this morning.  No return of symptoms in the last 10 hours.  At this time, differential include food poisoning, viral gastroenteritis, COVID-19.  I will swab the patient for Covid though I have a low suspicion that this is likely causative agent.  I will provide the patient with a prescription for antiemetics..  Is well-appearing, no abdominal pain, at this time I would not pursue labs or imaging.  Follow-up primary care as needed.  Patient is given ED precautions to return to the ED for any worsening or new symptoms.     ____________________________________________  FINAL CLINICAL IMPRESSION(S) / ED DIAGNOSES  Final diagnoses:  Viral gastroenteritis      NEW MEDICATIONS STARTED DURING THIS VISIT:  ED Discharge Orders         Ordered    promethazine (PHENERGAN) 25 MG tablet  Every 6 hours PRN  12/14/19 2146              This chart was dictated using voice recognition software/Dragon. Despite best efforts to proofread, errors can occur which can change the meaning. Any change was purely unintentional.    Lanette Hampshire 12/14/19 2147    Chesley Noon, MD 12/15/19 0000

## 2019-12-15 LAB — SARS CORONAVIRUS 2 (TAT 6-24 HRS): SARS Coronavirus 2: NEGATIVE

## 2020-09-18 ENCOUNTER — Emergency Department
Admission: EM | Admit: 2020-09-18 | Discharge: 2020-09-18 | Discharge status: Against Medical Advice | Payer: Medicaid Other

## 2020-09-18 ENCOUNTER — Emergency Department
Admission: EM | Admit: 2020-09-18 | Discharge: 2020-09-18 | Disposition: A | Discharge status: Home / Self Care | Payer: Medicaid Other | Attending: Emergency Medicine | Admitting: Emergency Medicine

## 2020-09-18 ENCOUNTER — Other Ambulatory Visit: Payer: Self-pay

## 2020-09-18 DIAGNOSIS — M545 Low back pain, unspecified: Secondary | ICD-10-CM | POA: Insufficient documentation

## 2020-09-18 DIAGNOSIS — Z5321 Procedure and treatment not carried out due to patient leaving prior to being seen by health care provider: Secondary | ICD-10-CM | POA: Insufficient documentation

## 2020-09-18 LAB — CBC
HCT: 44 % (ref 39.0–52.0)
Hemoglobin: 15.1 g/dL (ref 13.0–17.0)
MCH: 31 pg (ref 26.0–34.0)
MCHC: 34.3 g/dL (ref 30.0–36.0)
MCV: 90.3 fL (ref 80.0–100.0)
Platelets: 248 10*3/uL (ref 150–400)
RBC: 4.87 MIL/uL (ref 4.22–5.81)
RDW: 11.8 % (ref 11.5–15.5)
WBC: 7.4 10*3/uL (ref 4.0–10.5)
nRBC: 0 % (ref 0.0–0.2)

## 2020-09-18 LAB — COMPREHENSIVE METABOLIC PANEL
ALT: 38 U/L (ref 0–44)
AST: 26 U/L (ref 15–41)
Albumin: 4.6 g/dL (ref 3.5–5.0)
Alkaline Phosphatase: 56 U/L (ref 38–126)
Anion gap: 13 (ref 5–15)
BUN: 10 mg/dL (ref 6–20)
CO2: 23 mmol/L (ref 22–32)
Calcium: 9.6 mg/dL (ref 8.9–10.3)
Chloride: 102 mmol/L (ref 98–111)
Creatinine, Ser: 1.2 mg/dL (ref 0.61–1.24)
GFR, Estimated: >60 mL/min (ref >60)
Glucose, Bld: 113 mg/dL — ABNORMAL HIGH (ref 70–99)
Potassium: 3.8 mmol/L (ref 3.5–5.1)
Sodium: 138 mmol/L (ref 135–145)
Total Bilirubin: 0.6 mg/dL (ref 0.3–1.2)
Total Protein: 7.7 g/dL (ref 6.5–8.1)

## 2020-09-18 LAB — URINALYSIS, COMPLETE (UACMP) WITH MICROSCOPIC
Bacteria, UA: NONE SEEN
Bilirubin Urine: NEGATIVE
Glucose, UA: NEGATIVE mg/dL
Hgb urine dipstick: NEGATIVE
Ketones, ur: NEGATIVE mg/dL
Leukocytes,Ua: NEGATIVE
Nitrite: NEGATIVE
Protein, ur: NEGATIVE mg/dL
Specific Gravity, Urine: 1.02 (ref 1.005–1.030)
Squamous Epithelial / HPF: NONE SEEN (ref 0–5)
pH: 8 (ref 5.0–8.0)

## 2020-09-18 LAB — LIPASE, BLOOD: Lipase: 25 U/L (ref 11–51)

## 2020-09-18 MED ORDER — ACETAMINOPHEN 325 MG PO TABS
650.0000 mg | ORAL_TABLET | Freq: Once | ORAL | Status: AC
  Administered 2020-09-18: 650 mg via ORAL
  Filled 2020-09-18: qty 2

## 2020-09-18 NOTE — ED Triage Notes (Signed)
Patient reports back pain, denies any known injury.

## 2020-09-18 NOTE — ED Triage Notes (Signed)
Pt called for triage, no response. 

## 2020-09-18 NOTE — ED Triage Notes (Signed)
Pt called from [facility] no response 

## 2020-09-18 NOTE — ED Notes (Signed)
Pt reports in triage to writer that he has pain in the center of lumbar spine x1 day. Pt with fever. Denies SOB, chest pain, urinary symptoms.

## 2020-09-18 NOTE — ED Notes (Signed)
Pt called triage phone stating that he had been waiting since last night to be seen. Pt stated that he left about a hour and a half ago because he had been waiting so long and he was in pain. Pt asked if he could come back in to be seen. RN explained to pt that he could come back but since he left the property that he would have to check back in and go through the triage process again. Pt upset that he could not be seen in his original place in line stating that the night nurse told him that she would call him when there is a room available. RN explained that there has not been a room available for him to be seen which is why no one ever called him. RN again explained that since he left the hospital property that he has to check in again to be see but we would be happy to see him.

## 2020-09-18 NOTE — ED Notes (Signed)
Sitting in car, please call [phone]

## 2020-09-18 NOTE — ED Notes (Signed)
Pt called x's 3 for triage, no response 

## 2020-09-18 NOTE — ED Notes (Signed)
Patient waiting in car, unable to take vital signs.

## 2021-06-02 IMAGING — CR DG LUMBAR SPINE 2-3V
3 series · 3 of 3 positions shown · non-contrast
Comparison: None.

CLINICAL DATA: Back pain.

EXAM:
LUMBAR SPINE - 2-3 VIEW

[l-spine ap]
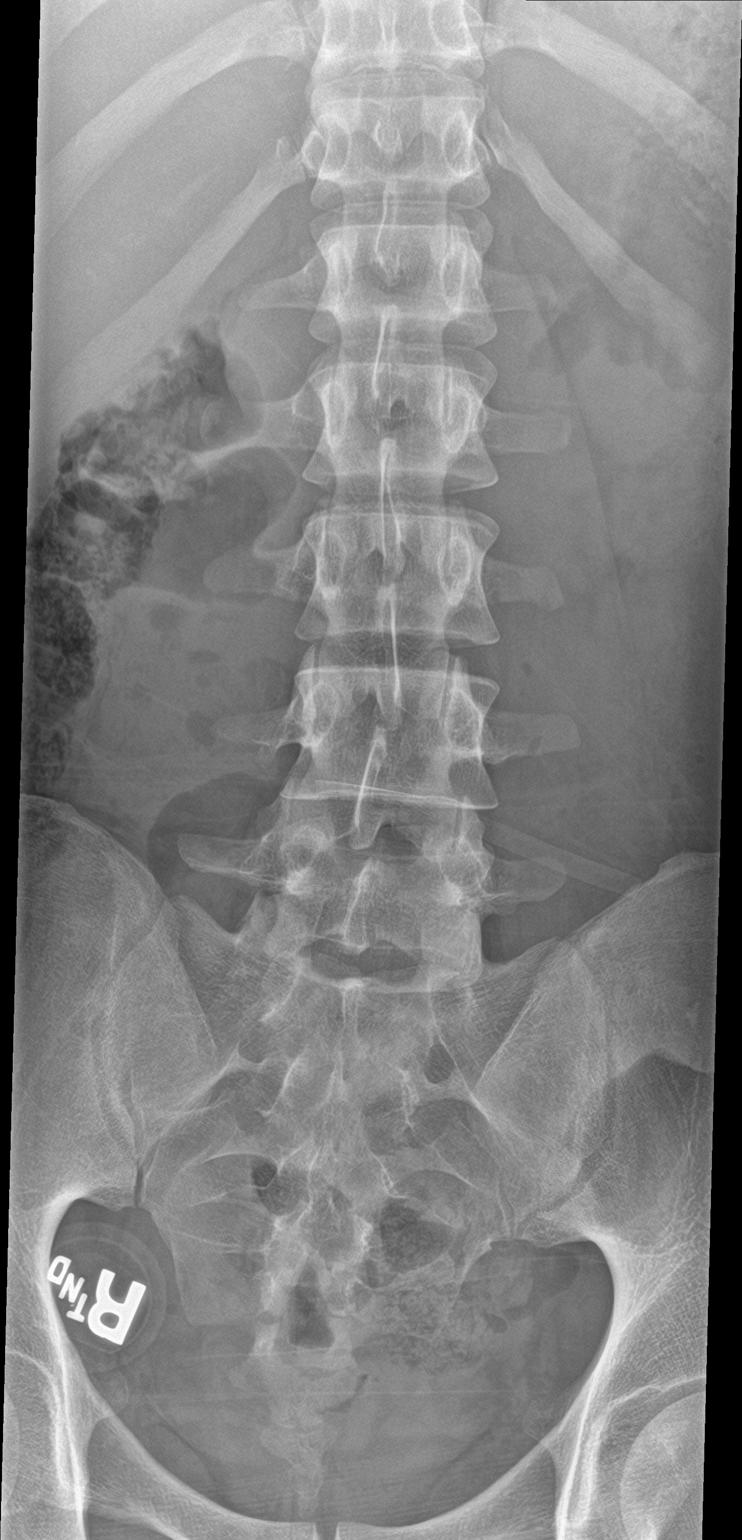

[l-spine spot]
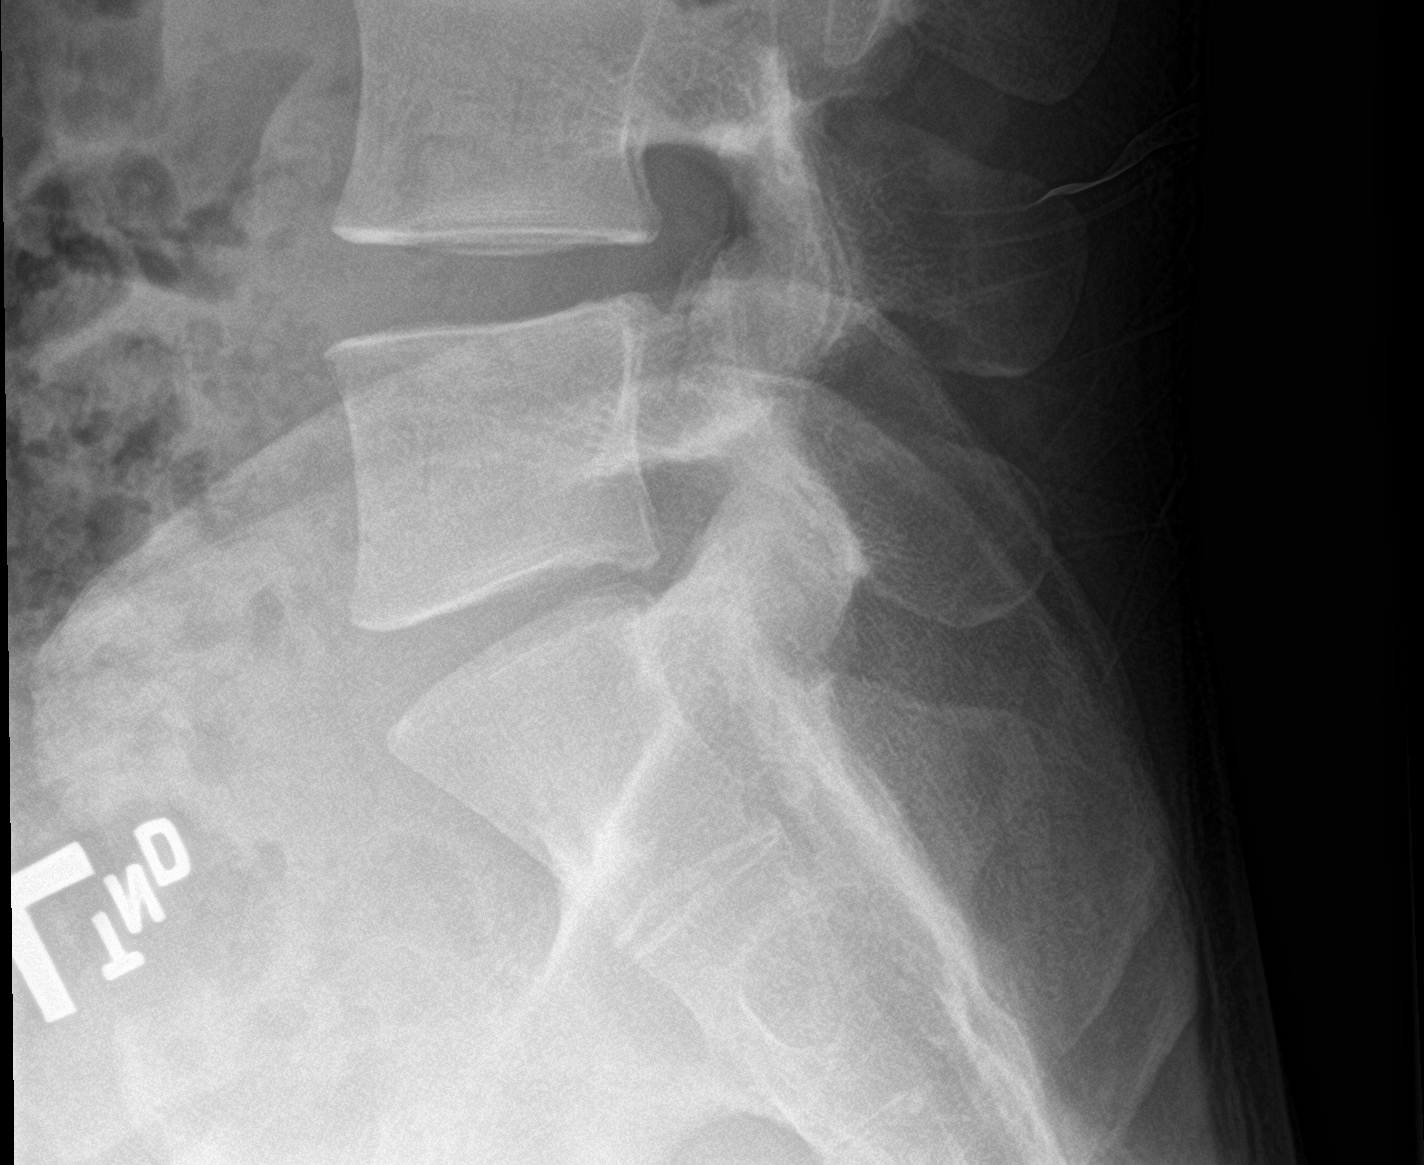

[l-spine lat]
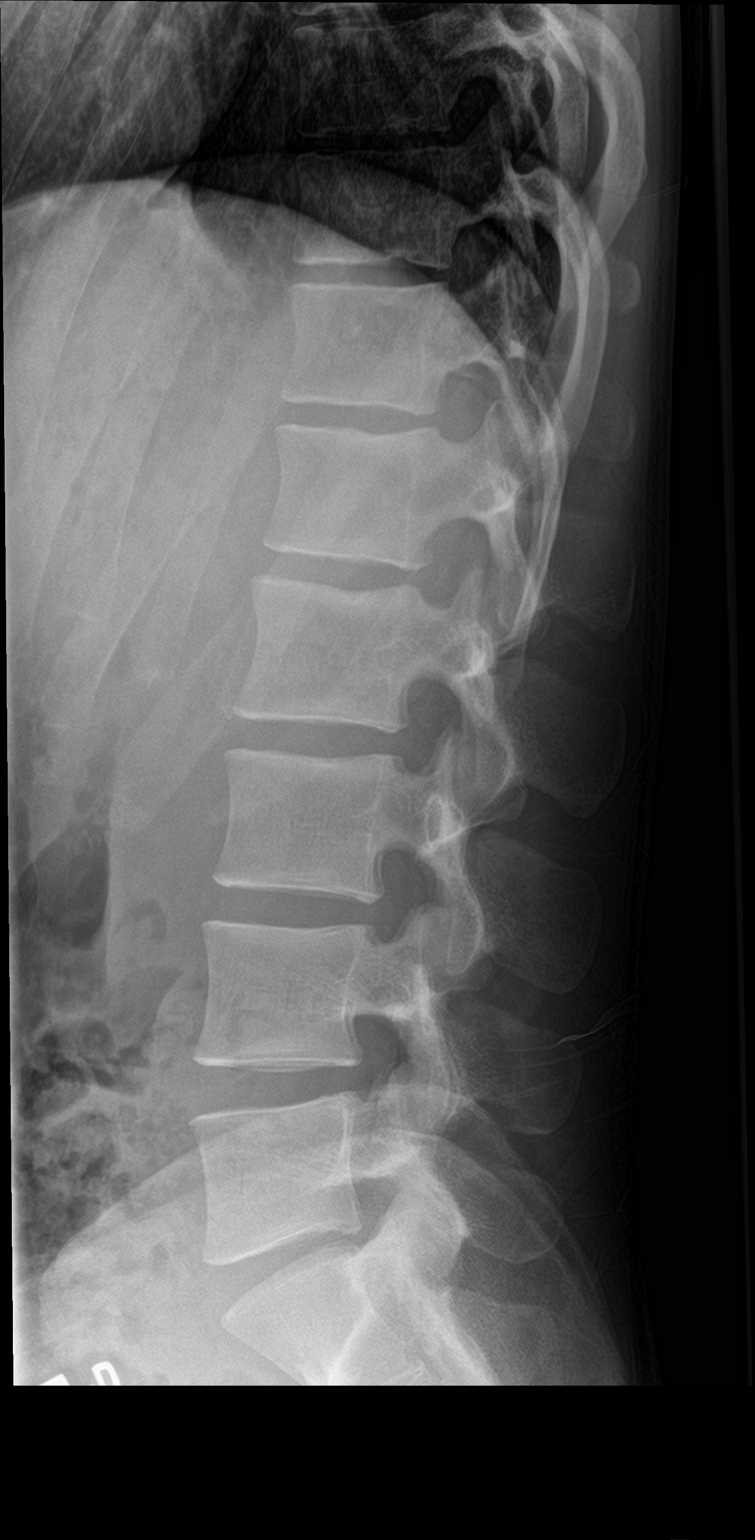

[3 of 3 positions shown; findings below may reference images not displayed]

FINDINGS: There is no evidence of lumbar spine fracture. Alignment is normal.
Intervertebral disc spaces are maintained.
IMPRESSION: Negative.

## 2023-07-15 ENCOUNTER — Other Ambulatory Visit: Payer: Self-pay

## 2023-07-15 DIAGNOSIS — Z5321 Procedure and treatment not carried out due to patient leaving prior to being seen by health care provider: Secondary | ICD-10-CM | POA: Insufficient documentation

## 2023-07-15 DIAGNOSIS — K469 Unspecified abdominal hernia without obstruction or gangrene: Secondary | ICD-10-CM | POA: Insufficient documentation

## 2023-07-15 LAB — CBC
HCT: 50.3 % (ref 39.0–52.0)
Hemoglobin: 16.7 g/dL (ref 13.0–17.0)
MCH: 31.3 pg (ref 26.0–34.0)
MCHC: 33.2 g/dL (ref 30.0–36.0)
MCV: 94.2 fL (ref 80.0–100.0)
Platelets: 321 10*3/uL (ref 150–400)
RBC: 5.34 MIL/uL (ref 4.22–5.81)
RDW: 12 % (ref 11.5–15.5)
WBC: 7.9 10*3/uL (ref 4.0–10.5)
nRBC: 0 % (ref 0.0–0.2)

## 2023-07-15 LAB — COMPREHENSIVE METABOLIC PANEL
ALT: 12 U/L (ref 0–44)
AST: 20 U/L (ref 15–41)
Albumin: 4.9 g/dL (ref 3.5–5.0)
Alkaline Phosphatase: 54 U/L (ref 38–126)
Anion gap: 11 (ref 5–15)
BUN: 10 mg/dL (ref 6–20)
CO2: 25 mmol/L (ref 22–32)
Calcium: 9.5 mg/dL (ref 8.9–10.3)
Chloride: 102 mmol/L (ref 98–111)
Creatinine, Ser: 1.05 mg/dL (ref 0.61–1.24)
GFR, Estimated: >60 mL/min (ref >60)
Glucose, Bld: 94 mg/dL (ref 70–99)
Potassium: 3.4 mmol/L — ABNORMAL LOW (ref 3.5–5.1)
Sodium: 138 mmol/L (ref 135–145)
Total Bilirubin: 0.6 mg/dL (ref 0.3–1.2)
Total Protein: 8.1 g/dL (ref 6.5–8.1)

## 2023-07-15 LAB — URINALYSIS, ROUTINE W REFLEX MICROSCOPIC
Bilirubin Urine: NEGATIVE
Glucose, UA: NEGATIVE mg/dL
Hgb urine dipstick: NEGATIVE
Ketones, ur: 20 mg/dL — AB
Leukocytes,Ua: NEGATIVE
Nitrite: NEGATIVE
Protein, ur: NEGATIVE mg/dL
Specific Gravity, Urine: 1.019 (ref 1.005–1.030)
pH: 7 (ref 5.0–8.0)

## 2023-07-15 LAB — LIPASE, BLOOD: Lipase: 23 U/L (ref 11–51)

## 2023-07-15 NOTE — ED Triage Notes (Signed)
Pt to ED via pov c/o hernia pain. Pt reports inguinal hernia on left side has been hurting for a few days and has gotten a little bigger. Pt says he is having trouble urinating fully. Denies CP, SOB, fevers

## 2023-07-16 ENCOUNTER — Emergency Department
Admission: EM | Admit: 2023-07-16 | Discharge: 2023-07-16 | Discharge status: Against Medical Advice | Payer: Medicaid Other | Attending: Emergency Medicine | Admitting: Emergency Medicine

## 2023-07-16 NOTE — ED Notes (Signed)
No answer when called several times from lobby
